# Patient Record
Sex: Female | Born: 1965 | Race: Black or African American | Hispanic: No | Marital: Married | Smoking: Current every day smoker
Health system: Southern US, Community
[De-identification: ages and names within clinical notes are randomized; demographics above are authoritative.]

## PROBLEM LIST (undated history)

## (undated) DIAGNOSIS — G5600 Carpal tunnel syndrome, unspecified upper limb: Secondary | ICD-10-CM

## (undated) DIAGNOSIS — M109 Gout, unspecified: Secondary | ICD-10-CM

---

## 2014-08-27 ENCOUNTER — Encounter (HOSPITAL_BASED_OUTPATIENT_CLINIC_OR_DEPARTMENT_OTHER): Payer: Self-pay | Admitting: Emergency Medicine

## 2014-08-27 ENCOUNTER — Emergency Department (HOSPITAL_BASED_OUTPATIENT_CLINIC_OR_DEPARTMENT_OTHER): Payer: Self-pay

## 2014-08-27 ENCOUNTER — Emergency Department (HOSPITAL_BASED_OUTPATIENT_CLINIC_OR_DEPARTMENT_OTHER)
Admission: EM | Admit: 2014-08-27 | Discharge: 2014-08-28 | Disposition: A | Discharge status: Home / Self Care | Payer: Self-pay | Attending: Emergency Medicine | Admitting: Emergency Medicine

## 2014-08-27 DIAGNOSIS — T300 Burn of unspecified body region, unspecified degree: Secondary | ICD-10-CM

## 2014-08-27 DIAGNOSIS — Y998 Other external cause status: Secondary | ICD-10-CM | POA: Insufficient documentation

## 2014-08-27 DIAGNOSIS — M436 Torticollis: Secondary | ICD-10-CM | POA: Insufficient documentation

## 2014-08-27 DIAGNOSIS — Y9289 Other specified places as the place of occurrence of the external cause: Secondary | ICD-10-CM | POA: Insufficient documentation

## 2014-08-27 DIAGNOSIS — X58XXXA Exposure to other specified factors, initial encounter: Secondary | ICD-10-CM | POA: Insufficient documentation

## 2014-08-27 DIAGNOSIS — Z720 Tobacco use: Secondary | ICD-10-CM | POA: Insufficient documentation

## 2014-08-27 DIAGNOSIS — Y9389 Activity, other specified: Secondary | ICD-10-CM | POA: Insufficient documentation

## 2014-08-27 DIAGNOSIS — T2007XA Burn of unspecified degree of neck, initial encounter: Secondary | ICD-10-CM | POA: Insufficient documentation

## 2014-08-27 DIAGNOSIS — Z3202 Encounter for pregnancy test, result negative: Secondary | ICD-10-CM | POA: Insufficient documentation

## 2014-08-27 HISTORY — DX: Carpal tunnel syndrome, unspecified upper limb: G56.00

## 2014-08-27 HISTORY — DX: Gout, unspecified: M10.9

## 2014-08-27 LAB — CBC
HCT: 35 % — ABNORMAL LOW (ref 36.0–46.0)
Hemoglobin: 11.9 g/dL — ABNORMAL LOW (ref 12.0–15.0)
MCH: 32 pg (ref 26.0–34.0)
MCHC: 34 g/dL (ref 30.0–36.0)
MCV: 94.1 fL (ref 78.0–100.0)
PLATELETS: 292 10*3/uL (ref 150–400)
RBC: 3.72 MIL/uL — AB (ref 3.87–5.11)
RDW: 12.6 % (ref 11.5–15.5)
WBC: 8 10*3/uL (ref 4.0–10.5)

## 2014-08-27 LAB — BASIC METABOLIC PANEL
Anion gap: 9 (ref 5–15)
BUN: 9 mg/dL (ref 6–20)
CHLORIDE: 105 mmol/L (ref 101–111)
CO2: 23 mmol/L (ref 22–32)
CREATININE: 0.64 mg/dL (ref 0.44–1.00)
Calcium: 8.7 mg/dL — ABNORMAL LOW (ref 8.9–10.3)
GFR calc Af Amer: >60 mL/min (ref >60)
GFR calc non Af Amer: >60 mL/min (ref >60)
Glucose, Bld: 113 mg/dL — ABNORMAL HIGH (ref 65–99)
Potassium: 3.2 mmol/L — ABNORMAL LOW (ref 3.5–5.1)
Sodium: 137 mmol/L (ref 135–145)

## 2014-08-27 LAB — PREGNANCY, URINE: PREG TEST UR: NEGATIVE

## 2014-08-27 MED ORDER — DIAZEPAM 5 MG/ML IJ SOLN
5.0000 mg | Freq: Once | INTRAMUSCULAR | Status: AC
  Administered 2014-08-27: 5 mg via INTRAVENOUS
  Filled 2014-08-27: qty 2

## 2014-08-27 MED ORDER — IOHEXOL 350 MG/ML SOLN
100.0000 mL | Freq: Once | INTRAVENOUS | Status: AC | PRN
  Administered 2014-08-27: 100 mL via INTRAVENOUS

## 2014-08-27 NOTE — ED Notes (Signed)
C/o neck pain onset 1400 this pm,  Pain getting worse  Hurts to move  Pains are sharp

## 2014-08-27 NOTE — ED Provider Notes (Signed)
CSN: [id number]     Arrival date & time 08/27/14  2102 History   First MD Initiated Contact with Patient 08/27/14 2132     Chief Complaint  Patient presents with  . Neck Pain     (Consider location/radiation/quality/duration/timing/severity/associated sxs/prior Treatment) HPI Comments: 49 year old female complaining of sudden onset neck pain occurring around 2 PM today while she was hanging clothes on a clothes line. Pain is sharp and throbbing, radiating around her entire neck, and feels "like a necklace is around her neck", feels stiff, with increasing, severe pain with movement. States the clothes line was at eye level and cannot recall any specific injury or trauma. Pain has been gradually worsening, unrelieved by a heating pad which she tried applying about one hour prior to arrival. Her husband noticed some red spots to the back of her neck and that it appeared swollen, the patient is not sure if they were present prior to putting heating pad on. Denies fevers, nausea, vomiting, extremity numbness, tingling or weakness.  Patient is a 49 y.o. female presenting with neck pain. The history is provided by the patient and the spouse.  Neck Pain   Past Medical History  Diagnosis Date  . Gout   . Carpal tunnel syndrome     Bilateral wrist   History reviewed. No pertinent past surgical history. History reviewed. No pertinent family history. History  Substance Use Topics  . Smoking status: Current Every Day Smoker -- 0.50 packs/day    Types: Cigarettes  . Smokeless tobacco: Not on file  . Alcohol Use: 3.6 oz/week    6 Cans of beer per week   OB History    No data available     Review of Systems  Musculoskeletal: Positive for neck pain and neck stiffness.  All other systems reviewed and are negative.     Allergies  Review of patient's allergies indicates no known allergies.  Home Medications   Prior to Admission medications   Not on File   BP 165/82 mmHg  Pulse 77   Temp(Src) 99.6 F (37.6 C) (Oral)  Resp 18  Ht 5' (1.524 m)  Wt 140 lb (63.504 kg)  BMI 27.34 kg/m2  SpO2 99%  LMP 08/17/2014 Physical Exam  Constitutional: She is oriented to person, place, and time. She appears well-developed and well-nourished. No distress.  HENT:  Head: Normocephalic and atraumatic.  Mouth/Throat: Oropharynx is clear and moist.  Eyes: Conjunctivae and EOM are normal.  Neck: Normal range of motion. Neck supple.  Cardiovascular: Normal rate, regular rhythm and normal heart sounds.   Pulmonary/Chest: Effort normal and breath sounds normal. No respiratory distress.  Musculoskeletal:  TTP cervical paraspinal muscles with spasm, BL SCM, and c-spine. ROM limited due to pain. Few red splotchy patches over R trapezius.  Neurological: She is alert and oriented to person, place, and time. No sensory deficit.  Strength UE 5/5 and equal BL.  Skin: Skin is warm and dry.  Psychiatric: She has a normal mood and affect. Her behavior is normal.  Nursing note and vitals reviewed.   ED Course  Procedures (including critical care time) Labs Review Labs Reviewed - No data to display  Imaging Review No results found.   EKG Interpretation None      MDM   Final diagnoses:  None   NAD. VSS. Temp 99.6, no reported fevers. Neck pain is generalized, with limited ROM due to pain. Spasm present. Red marks on posterior neck possibly from heating pad. Given extent of pain,  sudden onset without associated symptoms, cannot exclude dissection. Low suspicion for meningitis. Possibly all muscular. Will give valium, obtain basic labs and CT angio neck. Pt signed out to Dr. Littie Deeds at shift change. Pt also seen by Dr. Littie Deeds, agrees with plan.  Kathrynn Speed, PA-C 08/27/14 1610  Mirian Mo, MD 08/30/14 [phone]

## 2014-08-27 NOTE — ED Notes (Signed)
C/o generlaized neck pain after hanging cloths out on a cloths line. Denies injury. Pt is tearful at triage.

## 2014-08-28 MED ORDER — DIAZEPAM 5 MG PO TABS: 5.0000 mg | ORAL_TABLET | Freq: Four times a day (QID) | ORAL | Status: AC | PRN

## 2014-08-28 NOTE — Discharge Instructions (Signed)

## 2017-01-24 IMAGING — CT CT ANGIO NECK
2 of 7 series · 8 of 33 positions shown · IV contrast (APPLIED)
Comparison: None.

CLINICAL DATA: Initial evaluation for acute neck pain and stiffness
for 9 hours. No injury.

EXAM:
CT ANGIOGRAPHY NECK
TECHNIQUE: Multidetector CT imaging of the neck was performed using the
standard protocol during bolus administration of intravenous
contrast. Multiplanar CT image reconstructions and MIPs were
obtained to evaluate the vascular anatomy. Carotid stenosis
measurements (when applicable) are obtained utilizing NASCET
criteria, using the distal internal carotid diameter as the
denominator.
CONTRAST:  100mL OMNIPAQUE IOHEXOL 350 MG/ML SOLN

[Series 5: angio 2.0 b26f · axial · 0.44mm/px · z∈[-327,-255]mm · 2 of 108 slices shown]
[im 36/108  soft-tissue]
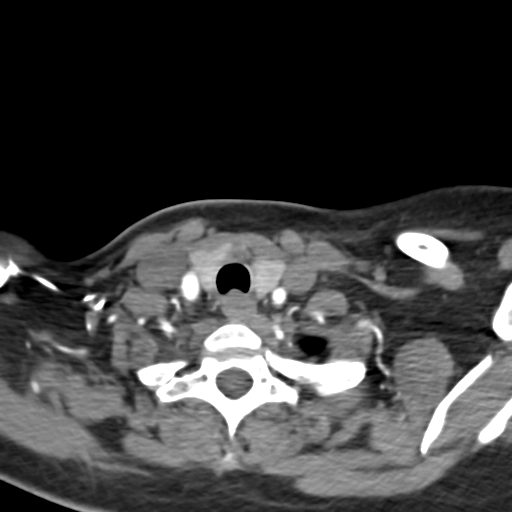
[im 72/108  soft-tissue]
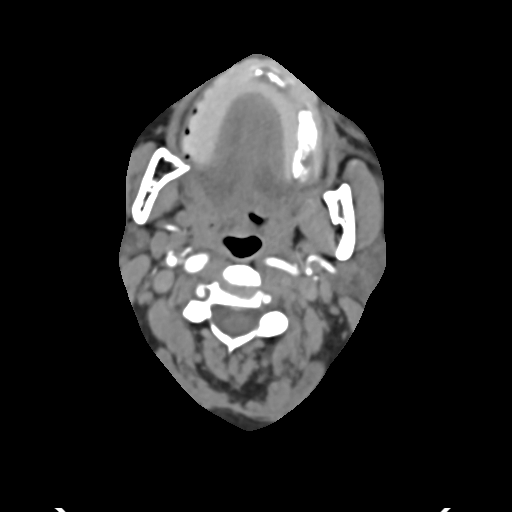

[Series 11: ax thin · axial · 0.45mm/px · z∈[-365,-214]mm · 6 of 213 slices shown]
[im 31/213  soft-tissue]
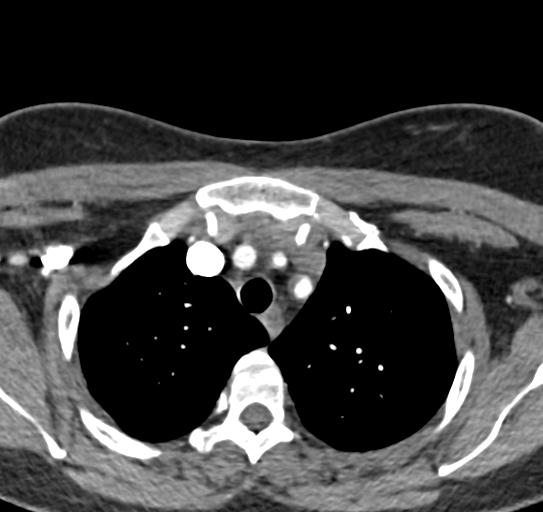
[im 61/213  bone]
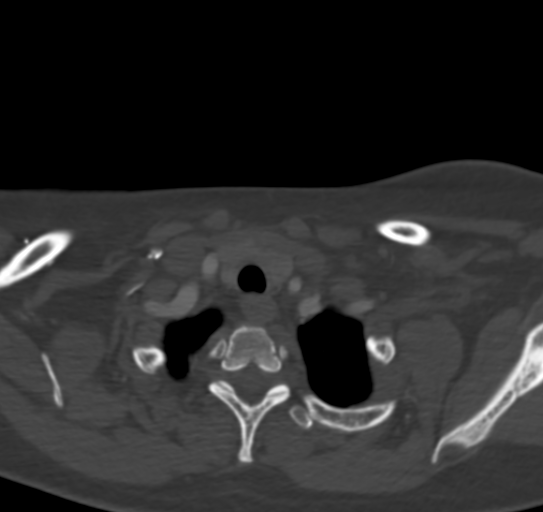
[im 91/213  soft-tissue]
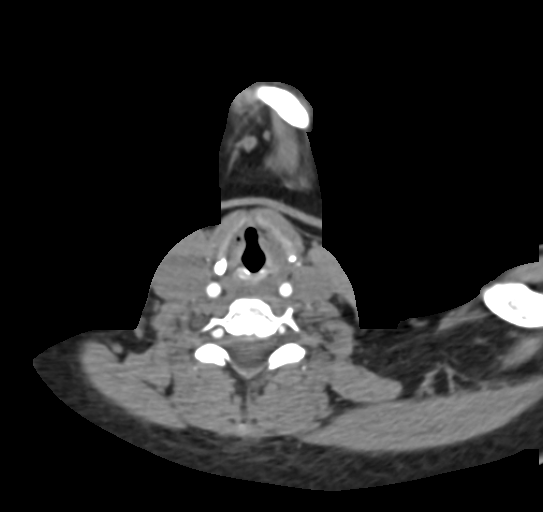
[im 122/213  bone]
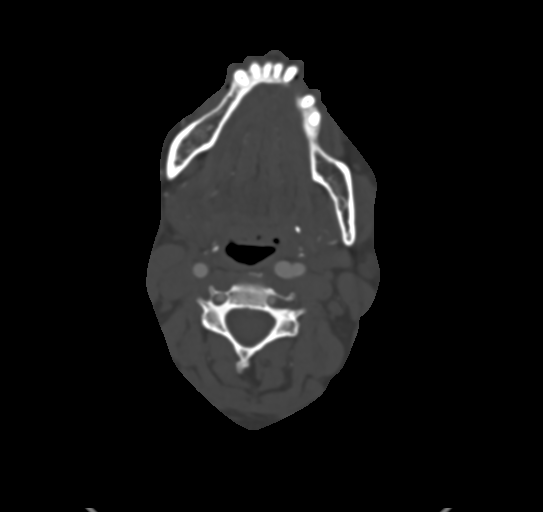
[im 152/213  soft-tissue]
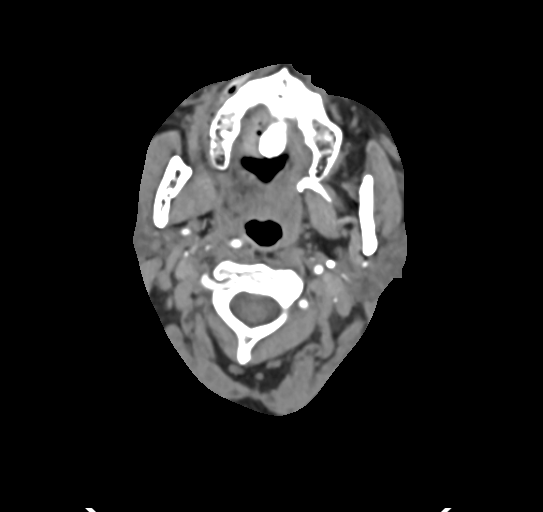
[im 182/213  bone]
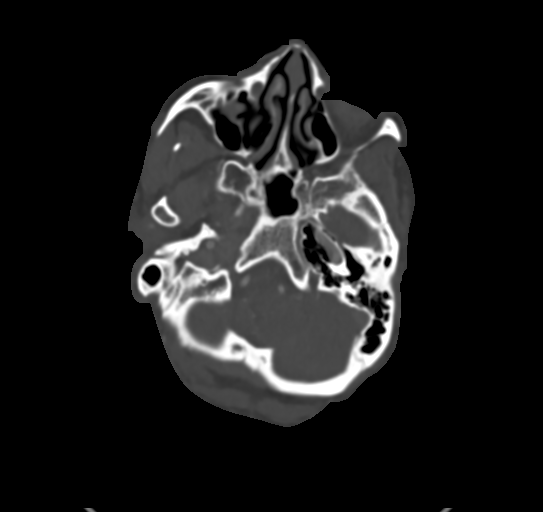

[8 of 33 positions shown; findings below may reference images not displayed]

FINDINGS: Aortic arch: Visualized portions of the aortic arch are normal
appearance with normal 3 vessel morphology. Minimal plaque present
within the aortic arch itself. No high-grade stenosis seen at the
origin of the great vessels. Visualized subclavian arteries are
widely patent.

Right carotid system: Right common carotid artery is widely patent
from its origin to the carotid bifurcation. Right internal carotid
artery medialized into the right retropharyngeal space. Right ICA is
widely patent from its origin to the circle of Willis without
evidence for significant stenosis, occlusion, or dissection. Mild
plaque present within the cavernous segment of the right ICA.

Left carotid system: Left common carotid artery widely patent from
its origin to the carotid bifurcation. Proximal left ICA medialized
into the retropharyngeal space. Left ICA is well opacified from its
origin to the level of the circle of Willis without evidence of
stenosis, occlusion, or dissection. Mild plaque present within the
left cavernous ICA.

Vertebral arteries:Both vertebral arteries arise from the subclavian
arteries. Vertebral arteries are widely patent from their origin to
the skullbase without evidence for significant stenosis, occlusion,
or dissection.

Skeleton: No acute osseous abnormality. No worrisome lytic or
blastic osseous lesion. Mild degenerative disc disease present at
C5-6 and C7-T1.

Other neck: Minimal subpleural atelectatic changes present within
the right lung apex. Visualized lungs are otherwise clear.
Visualized superior mediastinum within normal limits. Thyroid gland
normal. No acute soft tissue abnormality within the neck. No
adenopathy.
IMPRESSION: 1. Unremarkable CTA of the neck.
2. No other acute abnormality identified within the neck.
